# Patient Record
Sex: Female | Born: 1990 | Race: Black or African American | Hispanic: No | Marital: Single | Smoking: Never smoker
Health system: Southern US, Community
[De-identification: ages and names within clinical notes are randomized; demographics above are authoritative.]

## PROBLEM LIST (undated history)

## (undated) HISTORY — PX: WISDOM TOOTH EXTRACTION: SHX21

---

## 2012-10-06 ENCOUNTER — Emergency Department (HOSPITAL_COMMUNITY)

## 2012-10-06 ENCOUNTER — Emergency Department (HOSPITAL_COMMUNITY)
Admission: EM | Admit: 2012-10-06 | Discharge: 2012-10-07 | Disposition: A | Discharge status: Home / Self Care | Attending: Emergency Medicine | Admitting: Emergency Medicine

## 2012-10-06 ENCOUNTER — Encounter (HOSPITAL_COMMUNITY): Payer: Self-pay | Admitting: *Deleted

## 2012-10-06 DIAGNOSIS — R0789 Other chest pain: Secondary | ICD-10-CM

## 2012-10-06 LAB — CBC WITH DIFFERENTIAL/PLATELET
Basophils Relative: 0 % (ref 0–1)
Eosinophils Absolute: 0.1 10*3/uL (ref 0.0–0.7)
Eosinophils Relative: 2 % (ref 0–5)
Lymphs Abs: 2.4 10*3/uL (ref 0.7–4.0)
MCH: 28.4 pg (ref 26.0–34.0)
MCHC: 33.6 g/dL (ref 30.0–36.0)
MCV: 84.4 fL (ref 78.0–100.0)
Platelets: 222 10*3/uL (ref 150–400)
RBC: 4.62 MIL/uL (ref 3.87–5.11)

## 2012-10-06 LAB — COMPREHENSIVE METABOLIC PANEL
ALT: 10 U/L (ref 0–35)
BUN: 13 mg/dL (ref 6–23)
Calcium: 9.9 mg/dL (ref 8.4–10.5)
GFR calc Af Amer: >90 mL/min (ref >90)
Glucose, Bld: 98 mg/dL (ref 70–99)
Sodium: 137 mEq/L (ref 135–145)
Total Protein: 7.4 g/dL (ref 6.0–8.3)

## 2012-10-06 LAB — POCT I-STAT TROPONIN I: Troponin i, poc: 0.01 ng/mL (ref 0.00–0.08)

## 2012-10-06 NOTE — ED Notes (Signed)
Patient transported to X-ray 

## 2012-10-06 NOTE — ED Notes (Signed)
Pt state CP that started a week ago. Pt states that today the pain radiated down her left arm and she felt like her left arm was numb. Pt denies N/V SOB diaphoresis.

## 2012-10-07 MED ORDER — KETOROLAC TROMETHAMINE 60 MG/2ML IM SOLN
60.0000 mg | Freq: Once | INTRAMUSCULAR | Status: AC
  Administered 2012-10-07: 60 mg via INTRAMUSCULAR
  Filled 2012-10-07: qty 2

## 2012-10-07 MED ORDER — IBUPROFEN 600 MG PO TABS: 600.0000 mg | ORAL_TABLET | Freq: Four times a day (QID) | ORAL | Status: AC | PRN

## 2012-10-07 NOTE — ED Provider Notes (Signed)
CSN: [id number]     Arrival date & time 10/06/12  2153 History   First MD Initiated Contact with Patient 10/06/12 2327     Chief Complaint  Patient presents with  . Chest Pain   (Consider location/radiation/quality/duration/timing/severity/associated sxs/prior Treatment) Patient is a 22 y.o. female presenting with chest pain.  Chest Pain Pain location:  L chest Pain quality: sharp   Pain radiates to:  Does not radiate Pain radiates to the back: no   Pain severity:  Moderate Onset quality:  Sudden Duration:  1 week Timing:  Intermittent Progression:  Unchanged Chronicity:  New Context: at rest   Relieved by:  Nothing Worsened by:  Nothing tried Ineffective treatments:  None tried Associated symptoms: no abdominal pain, no anxiety, no back pain, no cough, no diaphoresis, no dizziness, no fatigue, no fever, no headache, no lower extremity edema, no nausea, no near-syncope, no numbness, no orthopnea, no palpitations, no PND, no shortness of breath, no syncope, not vomiting and no weakness   Risk factors: no aortic disease, no birth control, no coronary artery disease, no diabetes mellitus, no Ehlers-Danlos syndrome, no high cholesterol, no hypertension, no immobilization, not female, no Marfan's syndrome, not obese, not pregnant, no prior DVT/PE, no smoking and no surgery     History reviewed. No pertinent past medical history. Past Surgical History  Procedure Laterality Date  . Wisdom tooth extraction     History reviewed. No pertinent family history. History  Substance Use Topics  . Smoking status: Never Smoker   . Smokeless tobacco: Not on file  . Alcohol Use: Yes   OB History   Grav Para Term Preterm Abortions TAB SAB Ect Mult Living                 Review of Systems  Constitutional: Negative for fever, chills, diaphoresis, activity change, appetite change and fatigue.  HENT: Negative for congestion, sore throat, facial swelling, rhinorrhea, neck pain and neck stiffness.     Eyes: Negative for photophobia and discharge.  Respiratory: Negative for cough, chest tightness and shortness of breath.   Cardiovascular: Positive for chest pain. Negative for palpitations, orthopnea, leg swelling, syncope, PND and near-syncope.  Gastrointestinal: Negative for nausea, vomiting, abdominal pain and diarrhea.  Endocrine: Negative for polydipsia and polyuria.  Genitourinary: Negative for dysuria, frequency, difficulty urinating and pelvic pain.  Musculoskeletal: Negative for back pain and arthralgias.  Skin: Negative for color change and wound.  Allergic/Immunologic: Negative for immunocompromised state.  Neurological: Negative for dizziness, facial asymmetry, weakness, numbness and headaches.  Hematological: Does not bruise/bleed easily.  Psychiatric/Behavioral: Negative for confusion and agitation.    Allergies  Review of patient's allergies indicates no known allergies.  Home Medications   Current Outpatient Rx  Name  Route  Sig  Dispense  Refill  . ibuprofen (ADVIL,MOTRIN) 200 MG tablet   Oral   Take 600 mg by mouth every 6 (six) hours as needed for pain.         Marland Kitchen ibuprofen (ADVIL,MOTRIN) 600 MG tablet   Oral   Take 1 tablet (600 mg total) by mouth every 6 (six) hours as needed for pain.   30 tablet   0    BP 104/61  Pulse 78  Temp(Src) 97.9 F (36.6 C) (Oral)  Resp 16  SpO2 100%  LMP 09/26/2012 Physical Exam  Constitutional: She is oriented to person, place, and time. She appears well-developed and well-nourished. No distress.  HENT:  Head: Normocephalic and atraumatic.  Mouth/Throat: No oropharyngeal exudate.  Eyes: Pupils are equal, round, and reactive to light.  Neck: Normal range of motion. Neck supple.  Cardiovascular: Normal rate, regular rhythm and normal heart sounds.  Exam reveals no gallop and no friction rub.   No murmur heard. Pulmonary/Chest: Effort normal and breath sounds normal. No respiratory distress. She has no wheezes. She  has no rales. She exhibits bony tenderness.    Abdominal: Soft. Bowel sounds are normal. She exhibits no distension and no mass. There is no tenderness. There is no rebound and no guarding.  Musculoskeletal: Normal range of motion. She exhibits no edema and no tenderness.  Neurological: She is alert and oriented to person, place, and time.  Skin: Skin is warm and dry.  Psychiatric: She has a normal mood and affect.    ED Course  Procedures (including critical care time) Labs Review Labs Reviewed  COMPREHENSIVE METABOLIC PANEL - Abnormal; Notable for the following:    Total Bilirubin 0.2 (*)    All other components within normal limits  CBC WITH DIFFERENTIAL  POCT I-STAT TROPONIN I   Imaging Review Dg Chest 2 View  10/07/2012   *RADIOLOGY REPORT*  Clinical Data: Chest pain  CHEST - 2 VIEW  Comparison: None  Findings: The heart size and mediastinal contours are within normal limits.  Both lungs are clear.  The visualized skeletal structures are unremarkable.  IMPRESSION: Negative exam.   Original Report Authenticated By: Signa Kell, M.D.     Date: 10/07/2012  Rate: 79  Rhythm: normal sinus rhythm  QRS Axis: normal  Intervals: normal  ST/T Wave abnormalities: nonspecific T wave changes, aVL, TW flattening, TWI V1, V2  Conduction Disutrbances:none  Narrative Interpretation:   Old EKG Reviewed: none available    MDM   1. Chest pain, atypical    Pt is a 22 y.o. female with Pmhx as above who presents with intermittent L sided chest pain for about 1 week lasting 2-3 seconds at a time.  No assoc symptoms. Pt well appearing, in NAD on PE.  Cariopulm exam benign.  CP reproducible, resolved after IM toradol. CXR, CBC, CMP, trop ordered from triage protocol were unremarkable.  PERC negative.  Doubt PE, pna, pericarditis, ACS and believe pain likely MSK or costochondritis. Return precautions given for new or worsening symptoms, she can use NSAIDs at home for pain.   1. Chest pain,  atypical         Shanna Cisco, MD 10/07/12 2050

## 2012-10-07 NOTE — ED Notes (Signed)
Patient returned from X-ray 

## 2014-12-09 IMAGING — CR DG CHEST 2V
2 series · 2 of 2 positions shown · non-contrast
Comparison: None

CLINICAL DATA: Chest pain

CHEST - 2 VIEW

[w chest pa]
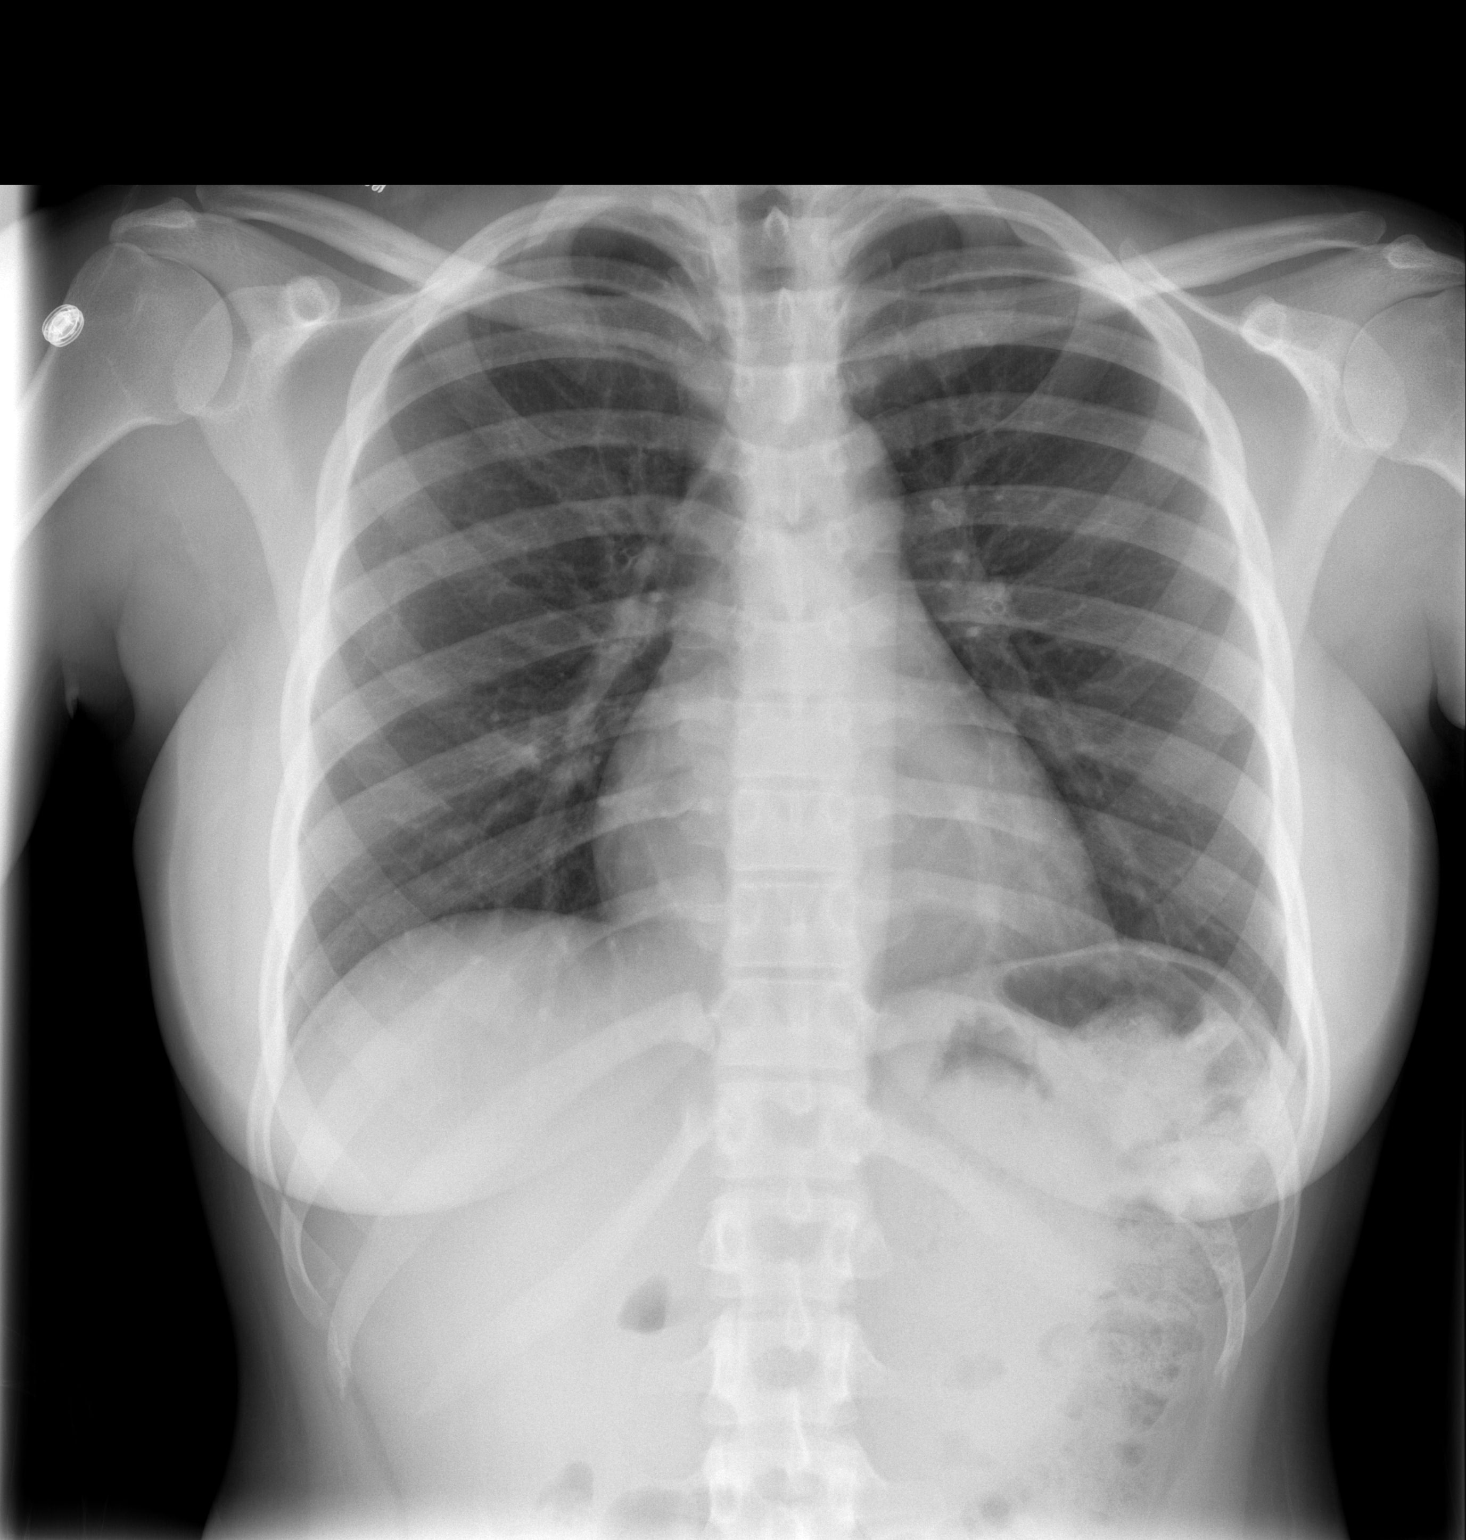

[w chest lat]
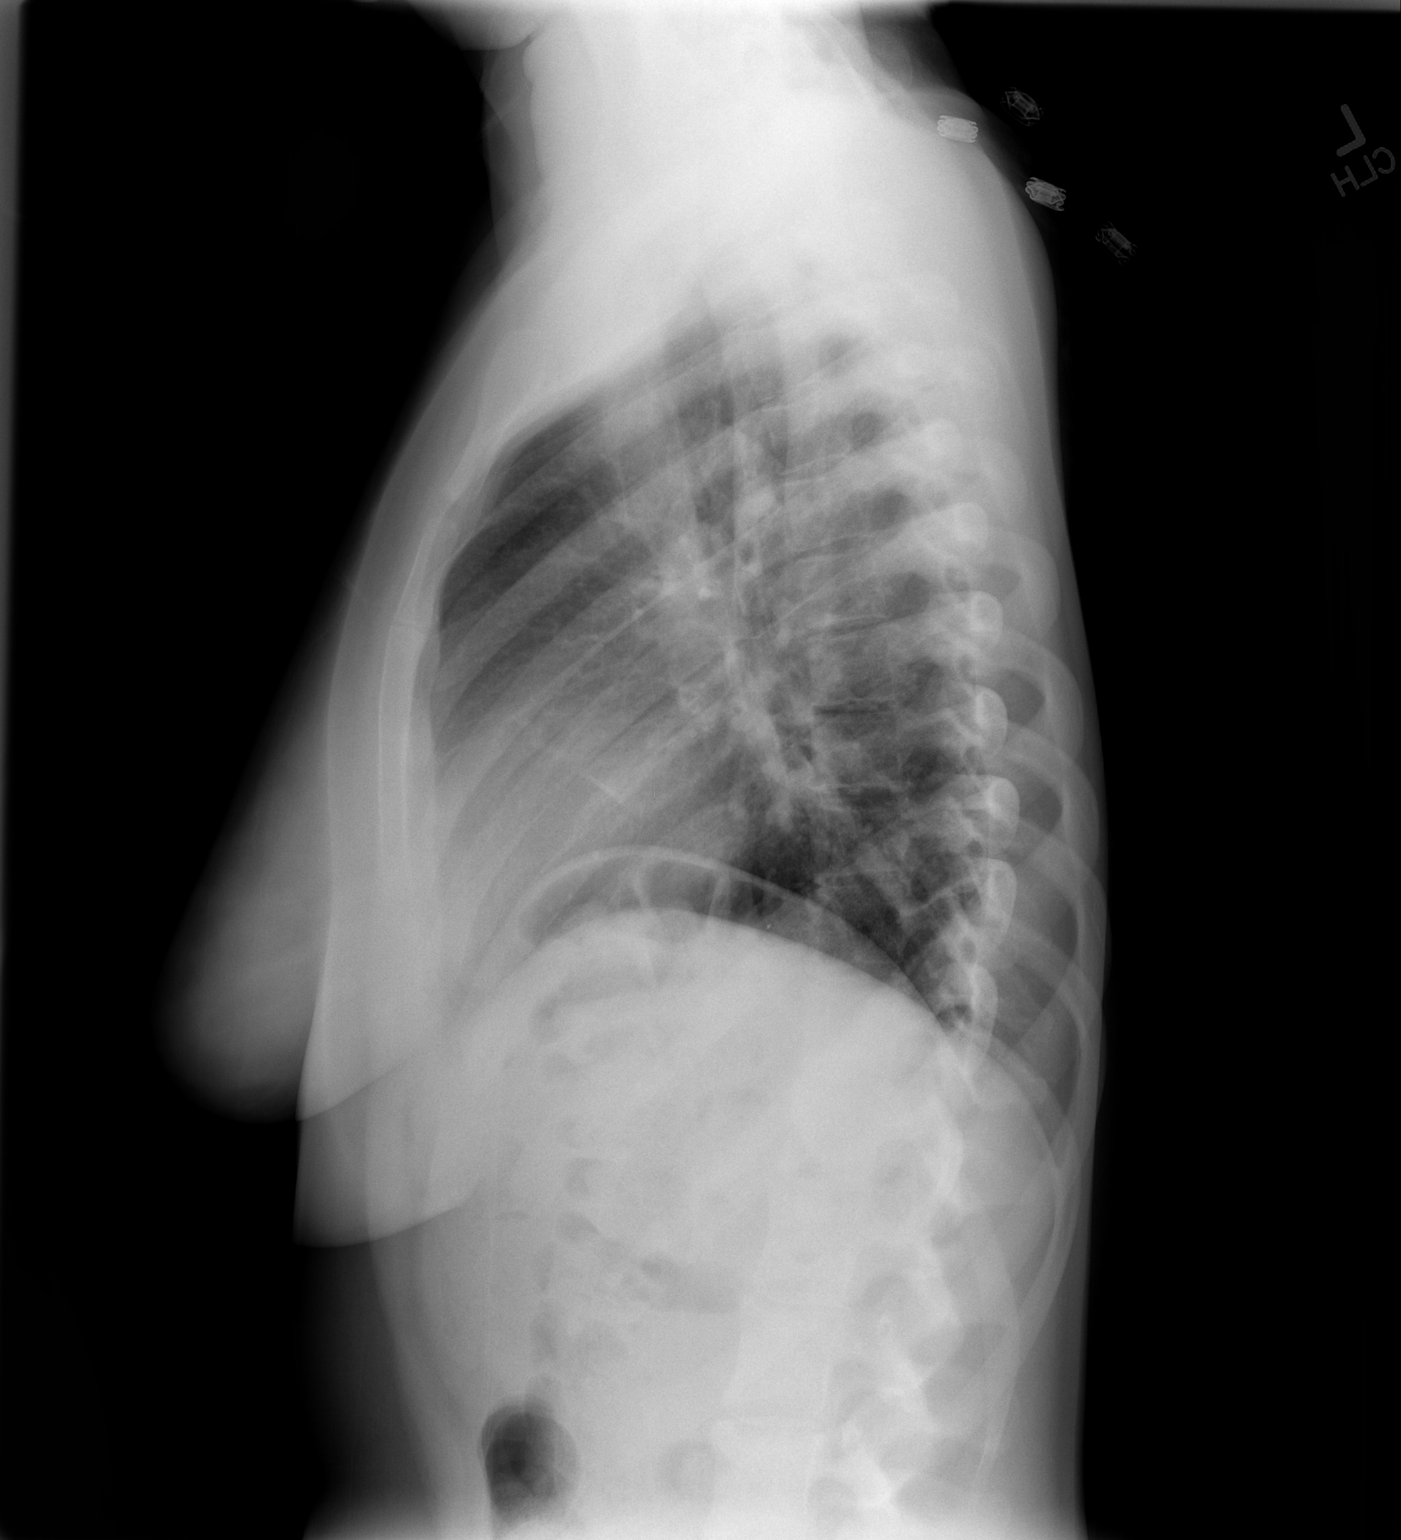

[2 of 2 positions shown; findings below may reference images not displayed]

FINDINGS: The heart size and mediastinal contours are within normal
limits.  Both lungs are clear.  The visualized skeletal structures
are unremarkable.
IMPRESSION: Negative exam.

## 2015-04-28 ENCOUNTER — Ambulatory Visit (INDEPENDENT_AMBULATORY_CARE_PROVIDER_SITE_OTHER): Admitting: Neurology

## 2015-04-28 ENCOUNTER — Ambulatory Visit (INDEPENDENT_AMBULATORY_CARE_PROVIDER_SITE_OTHER): Payer: Self-pay | Admitting: Neurology

## 2015-04-28 DIAGNOSIS — R202 Paresthesia of skin: Secondary | ICD-10-CM

## 2015-04-28 DIAGNOSIS — Z0289 Encounter for other administrative examinations: Secondary | ICD-10-CM

## 2015-04-28 NOTE — Procedures (Signed)
   NCS (NERVE CONDUCTION STUDY) WITH EMG (ELECTROMYOGRAPHY) REPORT   STUDY DATE:  April 28 2015 PATIENT NAME: Mary Rollins DOB: [birth year] MRN: [id number]    TECHNOLOGIST: Gearldine ShownLorraine Jones ELECTROMYOGRAPHER: Levert FeinsteinYan, Calvert Charland M.D.  CLINICAL INFORMATION:  25 years old female presenting with intermittent left arm and leg paresthesia  FINDINGS: NERVE CONDUCTION STUDY: Left peroneal sensory response was normal. Left peroneal to EDB and tibial motor responses were normal. Left tibial H reflex was present.  Left median, ulnar sensory and motor responses were normal.   NEEDLE ELECTROMYOGRAPHY: Selected needle examinations was performed at left upper and lower extremity muscles, left lumbosacral paraspinals, left cervical paraspinal muscles.   Needle examination of left tibialis anterior, tibialis posterior, medial gastrocnemius, vastus lateralis was normal   There was no spontaneous activity at left lumbosacral paraspinal muscles, left L4, L5, S1.  Selected needle examination of left upper extremity muscles, left first dorsal interossei, pronator teres, biceps, triceps, deltoid was normal.  There was no spontaneous activity at left cervical paraspinal muscles, left C5, 6, 7.  IMPRESSION:   This is a normal study, there is no electrodiagnostic evidence of left upper extremity neuropathy, left cervical radiculopathy, or left lumbar sacral radiculopathy.   INTERPRETING PHYSICIAN:   Levert FeinsteinYan, Mykel Mohl M.D. Ph.D. [facility] [address], [address] [place], [place] [address] [phone]

## 2015-04-28 NOTE — Progress Notes (Signed)
Electrodiagnostic study was normal, there is no evidence of left upper or lower extremity neuropathy, left cervical radiculopathy or left lumbar radiculopathy
